# Patient Record
Sex: Female | Born: 1960 | Race: White | Hispanic: No | Marital: Married | Smoking: Never smoker
Health system: Southern US, Community
[De-identification: ages and names within clinical notes are randomized; demographics above are authoritative.]

## PROBLEM LIST (undated history)

## (undated) HISTORY — PX: HERNIA REPAIR: SHX51

---

## 1997-04-19 ENCOUNTER — Encounter (HOSPITAL_COMMUNITY): Admission: RE | Admit: 1997-04-19 | Discharge: 1997-07-18 | Payer: Self-pay | Admitting: Obstetrics & Gynecology

## 1999-07-28 ENCOUNTER — Other Ambulatory Visit: Admission: RE | Admit: 1999-07-28 | Discharge: 1999-07-28 | Payer: Self-pay | Admitting: Obstetrics & Gynecology

## 2004-04-14 ENCOUNTER — Other Ambulatory Visit: Admission: RE | Admit: 2004-04-14 | Discharge: 2004-04-14 | Payer: Self-pay | Admitting: Obstetrics & Gynecology

## 2008-09-17 ENCOUNTER — Encounter (INDEPENDENT_AMBULATORY_CARE_PROVIDER_SITE_OTHER): Payer: Self-pay | Admitting: General Surgery

## 2008-09-17 ENCOUNTER — Ambulatory Visit (HOSPITAL_BASED_OUTPATIENT_CLINIC_OR_DEPARTMENT_OTHER): Admission: RE | Admit: 2008-09-17 | Discharge: 2008-09-17 | Payer: Self-pay | Admitting: General Surgery

## 2010-05-16 LAB — CBC
HCT: 38.3 % (ref 36.0–46.0)
HCT: 38.3 % (ref 36.0–46.0)
Hemoglobin: 12.2 g/dL (ref 12.0–15.0)
Hemoglobin: 12.5 g/dL (ref 12.0–15.0)
MCHC: 31.8 g/dL (ref 30.0–36.0)
MCHC: 32.6 g/dL (ref 30.0–36.0)
Platelets: 203 10*3/uL (ref 150–400)
RBC: 4.83 MIL/uL (ref 3.87–5.11)
RDW: 18.3 % — ABNORMAL HIGH (ref 11.5–15.5)
RDW: 19 % — ABNORMAL HIGH (ref 11.5–15.5)

## 2010-05-16 LAB — DIFFERENTIAL
Basophils Absolute: 0 10*3/uL (ref 0.0–0.1)
Basophils Absolute: 0 10*3/uL (ref 0.0–0.1)
Basophils Relative: 1 % (ref 0–1)
Basophils Relative: 1 % (ref 0–1)
Eosinophils Relative: 2 % (ref 0–5)
Eosinophils Relative: 2 % (ref 0–5)
Lymphocytes Relative: 27 % (ref 12–46)
Lymphocytes Relative: 29 % (ref 12–46)
Monocytes Absolute: 0.4 10*3/uL (ref 0.1–1.0)
Monocytes Absolute: 0.5 10*3/uL (ref 0.1–1.0)
Monocytes Relative: 9 % (ref 3–12)
Neutro Abs: 3.4 10*3/uL (ref 1.7–7.7)

## 2010-05-16 LAB — POCT PREGNANCY, URINE: Preg Test, Ur: NEGATIVE

## 2010-06-23 NOTE — Op Note (Signed)
NAMEBERTINA, GUTHRIDGE              ACCOUNT NO.:  [id number]   MEDICAL RECORD NO.:  [id number]          PATIENT TYPE:  AMB   LOCATION:  [facility]                         FACILITY:  [facility]   PHYSICIAN:  Anselm Pancoast. Weatherly, M.D.DATE OF BIRTH:  1960/09/30   DATE OF PROCEDURE:  09/17/2008  DATE OF DISCHARGE:                               OPERATIVE REPORT   PREOPERATIVE DIAGNOSES:  Right inguinal hernia, indirect.   OPERATION:  Right inguinal herniorrhaphy.  General anesthesia.  Local  supplementation.   HISTORY:  Erica Byrd is a 50 year old Caucasian female who was  referred to me for repair of right inguinal hernia.  She works as a  Runner, broadcasting/film/video at [employer] and stated that for the last several  months she has had a bulge intermittently in the right groin and  scheduled the appointment herself.  I had repaired a hernia on her son  about 4 years ago, and in the office on examination, you could see a  definite bulge.  It was well demonstrated with the ultrasound in our  office and appeared to have no weakness noted on the left.  I could not  find any evidence of a femoral hernia on examination in the office, and  she is here for the planned procedure.  We checked her hemoglobin which  was normal.  Pregnancy test was normal, and she is not on any chronic  medication.  She was given a gram of Ancef and taken back to the  operative suite.  I had marked where the hernia was and initialed the  right side.  Induction of general anesthesia and LOA tube, and I first  anesthetized the ilioinguinal nerve area with about 7 or 8 mL of  Marcaine with adrenalin.  Of course, she had been prepped with Betadine  solution and draped in a sterile manner prior to being anesthetized.  I  then where I had marked the little area made a little incision above the  femoral crease and sharp dissection down through Scarpa's tissue.  There  was one superficial vein that was clamped, divided, and ligated with  Vicryl, and then the external oblique aponeurosis was opened through the  external ring.  You could see a definite indirect hernia sac coming out  of the ilioinguinal nerve was retracted and protected and pushed  superiorly, and then the hernia sac was separated.  The round ligament  was clamped and ligated with 3-0 Vicryl laterally, and then I opened the  hernia sac and did a high sac ligation with 2-0 Vicryl and 2-0 Prolene.  I then removed the hernia sac.  There was a weakness in the floor, and  this was closed in kind of a Bassini-type repair with running 2-0  Prolene starting at the symphysis pubis, going up and closing the  internal ring and then going back tying the 2 ends together.  I did not  think we need any mesh.  There was no tension in the area.  There was  good tissue structure, and this was predominantly an indirect hernia  with this kind of a weakness where  she has stretched the floor.  The  external oblique was closed with a running 2-0 Vicryl.  The ilioinguinal  nerve had been protected, and I put additional Marcaine in the hernia  sac, but it was opened with high sac ligation and then also in where I  had repaired the floor.  The subcutaneous tissue was anesthetized as we  were closing.  Also about 25 mL of Marcaine was used.  Four-0 Vicryl was  used subcuticularly and Benzoin and Steri-Strips on the skin.  The  patient has a little papilloma lateral right at panty line, and I  excised this and lightly cauterize this.  The patient will be released  after a short stay in the recovery room.      Anselm Pancoast. Zachery Dakins, M.D.  Electronically Signed     WJW/MEDQ  D:  09/17/2008  T:  09/17/2008  Job:  [id number]

## 2011-05-05 ENCOUNTER — Other Ambulatory Visit: Payer: Self-pay | Admitting: Obstetrics & Gynecology

## 2012-03-08 ENCOUNTER — Ambulatory Visit (INDEPENDENT_AMBULATORY_CARE_PROVIDER_SITE_OTHER): Payer: BC Managed Care – PPO | Admitting: Emergency Medicine

## 2012-03-08 VITALS — BP 139/85 | HR 65 | Temp 97.9°F | Resp 16 | Ht 64.0 in | Wt 139.0 lb

## 2012-03-08 DIAGNOSIS — J111 Influenza due to unidentified influenza virus with other respiratory manifestations: Secondary | ICD-10-CM

## 2012-03-08 DIAGNOSIS — R05 Cough: Secondary | ICD-10-CM

## 2012-03-08 DIAGNOSIS — J029 Acute pharyngitis, unspecified: Secondary | ICD-10-CM

## 2012-03-08 MED ORDER — BENZONATATE 100 MG PO CAPS: 100.0000 mg | ORAL_CAPSULE | Freq: Three times a day (TID) | ORAL | Status: AC | PRN

## 2012-03-08 MED ORDER — OSELTAMIVIR PHOSPHATE 75 MG PO CAPS: 75.0000 mg | ORAL_CAPSULE | Freq: Two times a day (BID) | ORAL | Status: AC

## 2012-03-08 NOTE — Patient Instructions (Addendum)
Influenza, Adult Influenza ("the flu") is a viral infection of the respiratory tract. It occurs more often in winter months because people spend more time in close contact with one another. Influenza can make you feel very sick. Influenza easily spreads from person to person (contagious). CAUSES   Influenza is caused by a virus that infects the respiratory tract. You can catch the virus by breathing in droplets from an infected person's cough or sneeze. You can also catch the virus by touching something that was recently contaminated with the virus and then touching your mouth, nose, or eyes. SYMPTOMS   Symptoms typically last 4 to 10 days and may include:  Fever.   Chills.   Headache, body aches, and muscle aches.   Sore throat.   Chest discomfort and cough.   Poor appetite.   Weakness or feeling tired.   Dizziness.   Nausea or vomiting.  DIAGNOSIS   Diagnosis of influenza is often made based on your history and a physical exam. A nose or throat swab test can be done to confirm the diagnosis. RISKS AND COMPLICATIONS You may be at risk for a more severe case of influenza if you smoke cigarettes, have diabetes, have chronic heart disease (such as heart failure) or lung disease (such as asthma), or if you have a weakened immune system. Elderly people and pregnant women are also at risk for more serious infections. The most common complication of influenza is a lung infection (pneumonia). Sometimes, this complication can require emergency medical care and may be life-threatening. PREVENTION   An annual influenza vaccination (flu shot) is the best way to avoid getting influenza. An annual flu shot is now routinely recommended for all adults in the [place] TREATMENT   In mild cases, influenza goes away on its own. Treatment is directed at relieving symptoms. For more severe cases, your caregiver may prescribe antiviral medicines to shorten the sickness. Antibiotic medicines are not effective,  because the infection is caused by a virus, not by bacteria. HOME CARE INSTRUCTIONS  Only take over-the-counter or prescription medicines for pain, discomfort, or fever as directed by your caregiver.   Use a cool mist humidifier to make breathing easier.   Get plenty of rest until your temperature returns to normal. This usually takes 3 to 4 days.   Drink enough fluids to keep your urine clear or pale yellow.   Cover your mouth and nose when coughing or sneezing, and wash your hands well to avoid spreading the virus.   Stay home from work or school until your fever has been gone for at least 1 full day.  SEEK MEDICAL CARE IF:    You have chest pain or a deep cough that worsens or produces more mucus.   You have nausea, vomiting, or diarrhea.  SEEK IMMEDIATE MEDICAL CARE IF:    You have difficulty breathing, shortness of breath, or your skin or nails turn bluish.   You have severe neck pain or stiffness.   You have a severe headache, facial pain, or earache.   You have a worsening or recurring fever.   You have nausea or vomiting that cannot be controlled.  MAKE SURE YOU:  Understand these instructions.   Will watch your condition.   Will get help right away if you are not doing well or get worse.  Document Released: 01/23/2000 Document Revised: 07/27/2011 Document Reviewed: 04/26/2011 Exit[employer] Patient Information 2013 [employer].    

## 2012-03-08 NOTE — Progress Notes (Signed)
  Subjective:    Patient ID: Erica Byrd, female    DOB: 1960-07-14, 52 y.o.   MRN: [id number]  HPI A 52 year old female presents to clinic with flu like symptoms.  She has symptoms of nasal congestion, body aches, cough, and sore throat. The symptoms started on Monday and got worse over the last day. She has pressure and congestion in her sinuses but no fever. 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     Review of Systems     Objective:   Physical Exam patient is alert and cooperative not ill-appearing. HEENT exam. TMs are clear. Nose is congested. Throat is slightly red the chest is clear to both auscultation and percussion. Cardiac exam is regular rate without murmurs  Results for orders placed in visit on 03/08/12  POCT RAPID STREP A (OFFICE)      Component Value Range   Rapid Strep A Screen Negative  Negative  POCT INFLUENZA A/B      Component Value Range   Influenza A, POC Negative     Influenza B, POC Negative          Assessment & Plan:  Patient presents with flulike symptoms. Flu test is negative but we'll go ahead and treat for flu .

## 2013-01-24 ENCOUNTER — Ambulatory Visit (INDEPENDENT_AMBULATORY_CARE_PROVIDER_SITE_OTHER): Payer: BC Managed Care – PPO | Admitting: Emergency Medicine

## 2013-01-24 ENCOUNTER — Ambulatory Visit: Payer: BC Managed Care – PPO

## 2013-01-24 VITALS — BP 110/68 | HR 98 | Temp 100.2°F | Resp 18 | Ht 65.0 in | Wt 139.0 lb

## 2013-01-24 DIAGNOSIS — R059 Cough, unspecified: Secondary | ICD-10-CM

## 2013-01-24 DIAGNOSIS — R509 Fever, unspecified: Secondary | ICD-10-CM

## 2013-01-24 DIAGNOSIS — J111 Influenza due to unidentified influenza virus with other respiratory manifestations: Secondary | ICD-10-CM

## 2013-01-24 DIAGNOSIS — R05 Cough: Secondary | ICD-10-CM

## 2013-01-24 DIAGNOSIS — J101 Influenza due to other identified influenza virus with other respiratory manifestations: Secondary | ICD-10-CM

## 2013-01-24 LAB — POCT CBC
Granulocyte percent: 77.1 %G (ref 37–80)
HCT, POC: 45.1 % (ref 37.7–47.9)
Hemoglobin: 14 g/dL (ref 12.2–16.2)
Lymph, poc: 1 (ref 0.6–3.4)
MCH, POC: 29.9 pg (ref 27–31.2)
MCHC: 31 g/dL — AB (ref 31.8–35.4)
MCV: 96.4 fL (ref 80–97)
MID (cbc): 0.4 (ref 0–0.9)
MPV: 10.4 fL (ref 0–99.8)
POC Granulocyte: 4.5 (ref 2–6.9)
POC LYMPH PERCENT: 16.4 %L (ref 10–50)
POC MID %: 6.5 %M (ref 0–12)
Platelet Count, POC: 180 10*3/uL (ref 142–424)
RBC: 4.68 M/uL (ref 4.04–5.48)
RDW, POC: 14.9 %
WBC: 5.8 10*3/uL (ref 4.6–10.2)

## 2013-01-24 LAB — POCT INFLUENZA A/B
Influenza A, POC: POSITIVE
Influenza B, POC: NEGATIVE

## 2013-01-24 MED ORDER — HYDROCOD POLST-CHLORPHEN POLST 10-8 MG/5ML PO LQCR: 5.0000 mL | Freq: Two times a day (BID) | ORAL | Status: AC | PRN

## 2013-01-24 MED ORDER — OSELTAMIVIR PHOSPHATE 75 MG PO CAPS: 75.0000 mg | ORAL_CAPSULE | Freq: Two times a day (BID) | ORAL | Status: AC

## 2013-01-24 NOTE — Progress Notes (Signed)
Subjective:    Patient ID: Erica Byrd, female    DOB: 12-Oct-1960, 52 y.o.   MRN: [id number]  Cough Associated symptoms include a fever, postnasal drip and rhinorrhea. Pertinent negatives include no ear pain, headaches, sore throat, shortness of breath or wheezing.  Fever  Associated symptoms include congestion and coughing. Pertinent negatives include no ear pain, headaches, nausea, sore throat, vomiting or wheezing.   52 year old female presents for evaluation of several symptoms. Complains of cough x 5 weeks. Has been treating at home because she thought she had a virus. 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  At onset 5 weeks ago, she did have low grade fever of 99-100.0. She has been taking dayquil/nyquil which has helped some with the cough.  Admits she did get her flu shot this year.  Denies nausea, vomiting, headache, sore throat, otalgia, chest pain, SOB, wheezing, or sinus pain.    Review of Systems  Constitutional: Positive for fever.  HENT: Positive for congestion, postnasal drip and rhinorrhea. Negative for ear pain, sinus pressure and sore throat.   Respiratory: Positive for cough. Negative for shortness of breath and wheezing.   Gastrointestinal: Negative for nausea and vomiting.  Neurological: Negative for dizziness and headaches.       Objective:   Physical Exam  Constitutional: She is oriented to person, place, and time. She appears well-developed and well-nourished.  HENT:  Head: Normocephalic and atraumatic.  Right Ear: Hearing, tympanic membrane, external ear and ear canal normal.  Left Ear: Hearing, tympanic membrane, external ear and ear canal normal.  Mouth/Throat: Uvula is midline, oropharynx is clear and moist and mucous membranes are normal.  Eyes: Conjunctivae are normal.  Neck: Normal range of motion. Neck supple.  Cardiovascular: Normal rate, regular rhythm and normal heart sounds.   Pulmonary/Chest:  Effort normal and breath sounds normal.  Lymphadenopathy:    She has no cervical adenopathy.  Neurological: She is alert and oriented to person, place, and time.  Psychiatric: She has a normal mood and affect. Her behavior is normal. Judgment and thought content normal.     Results for orders placed in visit on 01/24/13  POCT CBC      Result Value Range   WBC 5.8  4.6 - 10.2 K/uL   Lymph, poc 1.0  0.6 - 3.4   POC LYMPH PERCENT 16.4  10 - 50 %L   MID (cbc) 0.4  0 - 0.9   POC MID % 6.5  0 - 12 %M   POC Granulocyte 4.5  2 - 6.9   Granulocyte percent 77.1  37 - 80 %G   RBC 4.68  4.04 - 5.48 M/uL   Hemoglobin 14.0  12.2 - 16.2 g/dL   HCT, POC 40.9  81.1 - 47.9 %   MCV 96.4  80 - 97 fL   MCH, POC 29.9  27 - 31.2 pg   MCHC 31.0 (*) 31.8 - 35.4 g/dL   RDW, POC 91.4     Platelet Count, POC 180  142 - 424 K/uL   MPV 10.4  0 - 99.8 fL  POCT INFLUENZA A/B      Result Value Range   Influenza A, POC Positive     Influenza B, POC Negative      [facility] reading (PRIMARY) by  Dr. Cleta Alberts as mild blunting of right costophrenic angle. No acute infiltrate or consolidation.       Assessment & Plan:  Cough - Plan: POCT CBC, DG Chest 2 View, chlorpheniramine-HYDROcodone (TUSSIONEX PENNKINETIC ER) 10-8 MG/5ML LQCR  Fever, unspecified - Plan: POCT CBC, POCT Influenza A/B, DG Chest 2 View  Influenza A - Plan: oseltamivir (TAMIFLU) 75 MG capsule  Will go ahead and start Tamiflu 75 mg twice daily x 5 days Tussionex q12 hours prn cough Increase fluids and rest Continue Tylenol or ibuprofen as needed for fever and chills Follow up if symptoms worsen or fail to improve  I have also sent a prescription for Tamiflu for her son, Jaylen Claude

## 2015-01-01 IMAGING — CR DG CHEST 2V
2 series · 2 of 2 positions shown · non-contrast
Comparison: None.

CLINICAL DATA: Cough and fever.

EXAM:
CHEST  2 VIEW

[PA]
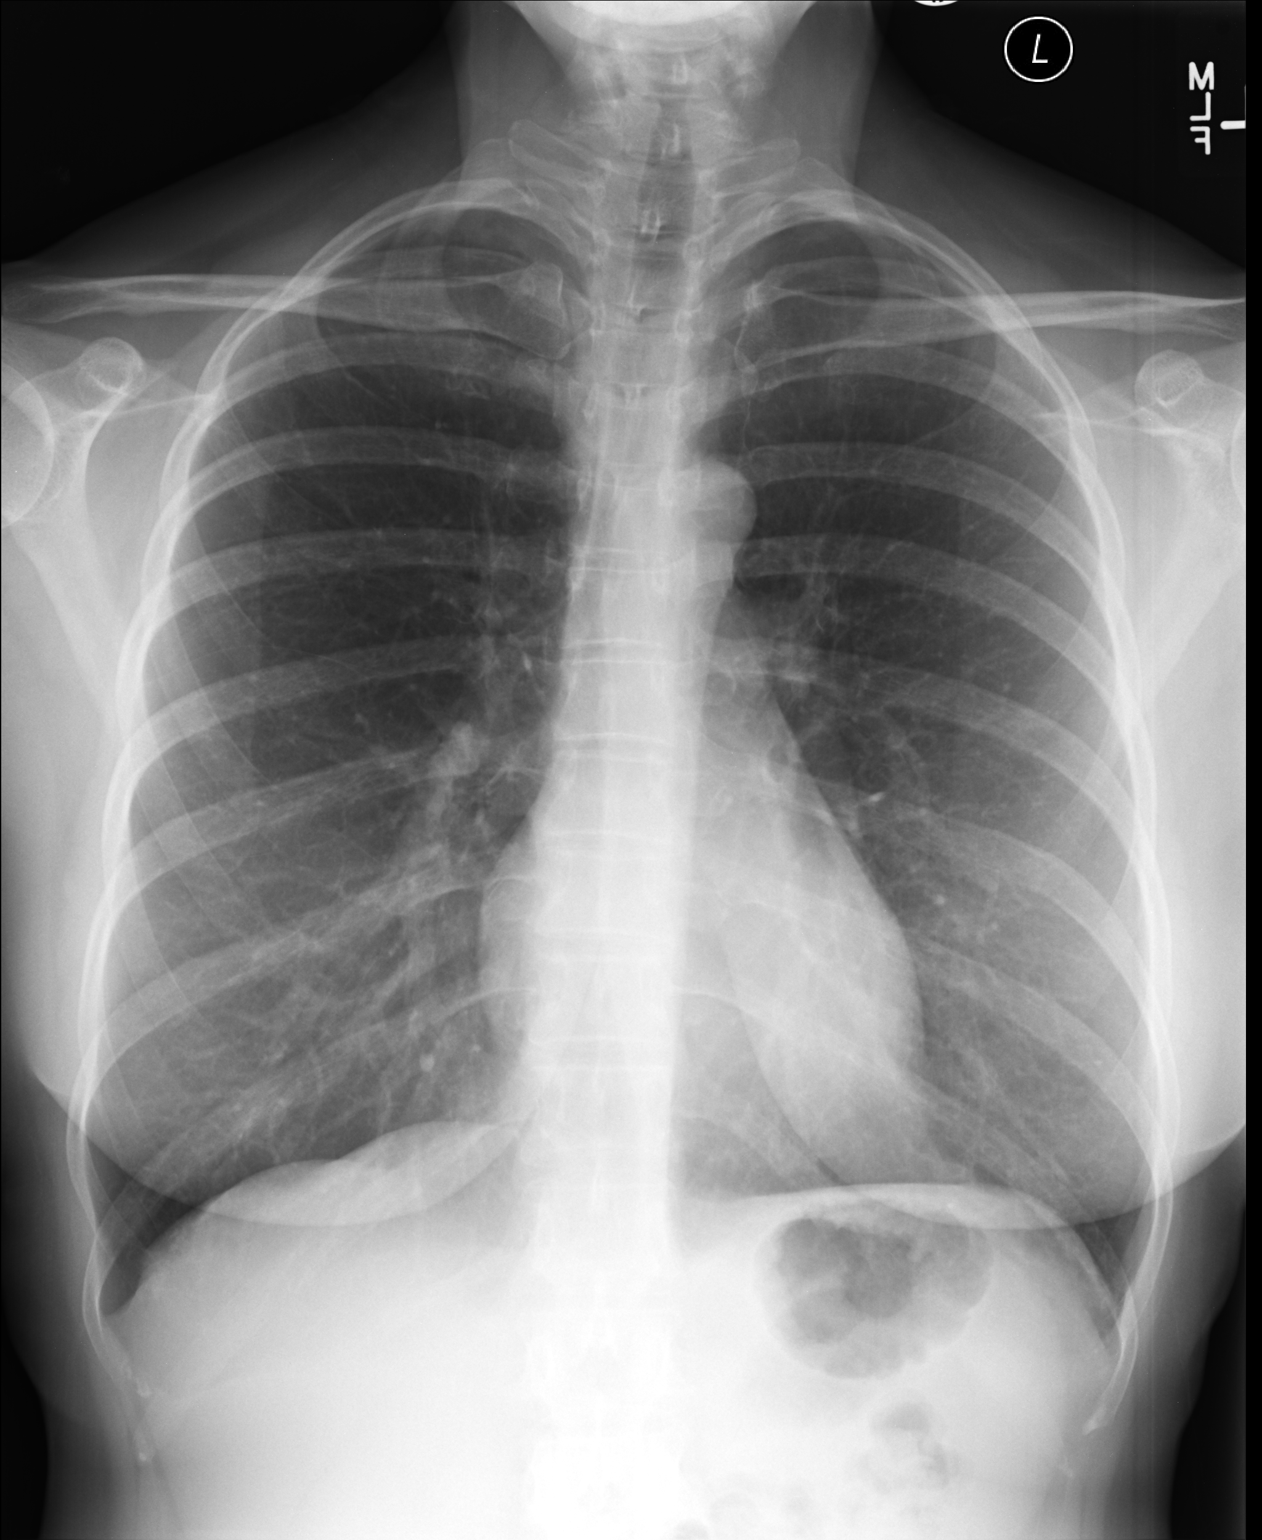

[lateral]
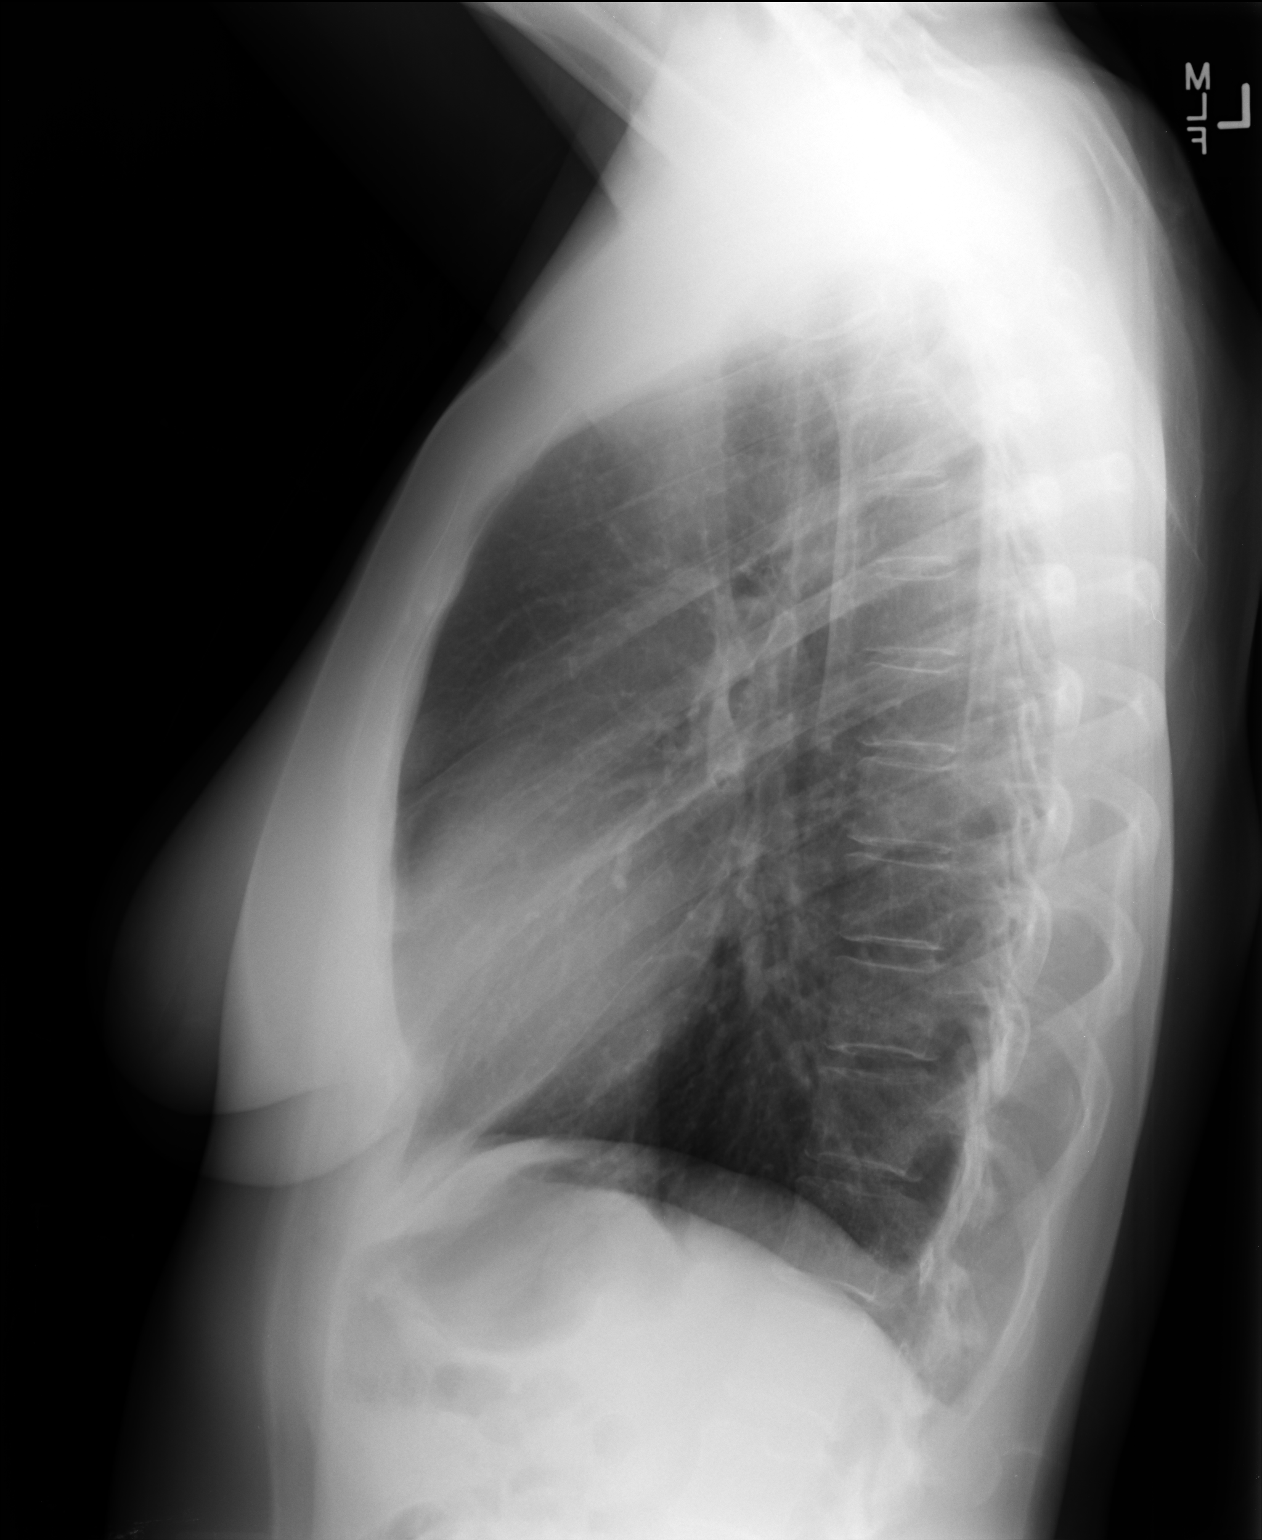

[2 of 2 positions shown; findings below may reference images not displayed]

FINDINGS: Trachea is midline. Heart size normal. Lungs are somewhat
hyperinflated but clear. No pleural fluid.
IMPRESSION: No acute findings.

## 2018-01-04 ENCOUNTER — Ambulatory Visit (INDEPENDENT_AMBULATORY_CARE_PROVIDER_SITE_OTHER): Payer: Self-pay

## 2018-01-04 ENCOUNTER — Encounter (INDEPENDENT_AMBULATORY_CARE_PROVIDER_SITE_OTHER): Payer: Self-pay | Admitting: Family Medicine

## 2018-01-04 ENCOUNTER — Ambulatory Visit (INDEPENDENT_AMBULATORY_CARE_PROVIDER_SITE_OTHER): Payer: BC Managed Care – PPO | Admitting: Family Medicine

## 2018-01-04 DIAGNOSIS — M79641 Pain in right hand: Secondary | ICD-10-CM

## 2018-01-04 NOTE — Progress Notes (Signed)
   Office Visit Note   Patient: Erica SamsMartha A Templeton           Date of Birth: 12/16/1960           MRN: [id number] Visit Date: 01/04/2018 Requested by: No referring provider defined for this encounter. PCP: System, Pcp Not In  Subjective: Chief Complaint  Patient presents with  . Right Hand - Pain, Injury    Fell on ice/snow while out of town 2 weeks ago. Caught self with outstretched hand, hitting elbow and hand on the ground.  Right hand dominant. Is a sign language interpreter.    HPI: She is a 57 year old right-hand-dominant female with right hand pain.  [place] play [place] [employer] in football.  On her way out, she slipped on ice and fell trying to catch herself with her right hand.  Immediate pain and swelling, but she thought she had just bruised it.  The swelling has improved but her pain has not gone away completely especially with certain twisting movements or when trying to write.  No previous injuries to her hand.  She points to the dorsum of her hand near the wrist as the location of her pain.              ROS: She is otherwise in good health, all other systems were negative.  Objective: Vital Signs: There were no vitals taken for this visit.  Physical Exam:  Right hand: No swelling or bruising visible.  She is tender primarily on the dorsal aspect of her wrist at the base of the fourth and fifth metacarpals.  A little bit of pain in the TFC, little bit of tenderness in the anatomic snuffbox.  Imaging: X-rays right hand: Anatomic alignment, I do not think she has a definite fracture.  On the lateral view I question whether she might have an avulsion fracture, but this was not readily evident with limited ultrasound imaging which was not recorded.    Assessment & Plan: 1.  2 weeks status post fall with right wrist pain, possible sprain versus occult fracture -Removable splint for comfort, anticipate 3 to 4  weeks healing time.  If still having pain at that point we will recheck with three-view x-rays.   Follow-Up Instructions: No follow-ups on file.      Procedures: No procedures performed  No notes on file    PMFS History: There are no active problems to display for this patient.  History reviewed. No pertinent past medical history.  History reviewed. No pertinent family history.  Past Surgical History:  Procedure Laterality Date  . HERNIA REPAIR     Social History   Occupational History  . Not on file  Tobacco Use  . Smoking status: Never Smoker  Substance and Sexual Activity  . Alcohol use: Not on file  . Drug use: Not on file  . Sexual activity: Not on file

## 2018-11-06 ENCOUNTER — Other Ambulatory Visit: Payer: Self-pay

## 2018-11-06 DIAGNOSIS — Z20822 Contact with and (suspected) exposure to covid-19: Secondary | ICD-10-CM

## 2018-11-07 LAB — NOVEL CORONAVIRUS, NAA: SARS-CoV-2, NAA: NOT DETECTED

## 2020-01-18 ENCOUNTER — Other Ambulatory Visit: Payer: Self-pay | Admitting: Obstetrics & Gynecology

## 2020-01-18 DIAGNOSIS — R928 Other abnormal and inconclusive findings on diagnostic imaging of breast: Secondary | ICD-10-CM

## 2020-01-25 ENCOUNTER — Ambulatory Visit
Admission: RE | Admit: 2020-01-25 | Discharge: 2020-01-25 | Disposition: A | Discharge status: Home / Self Care | Payer: Self-pay | Source: Ambulatory Visit | Attending: Obstetrics & Gynecology | Admitting: Obstetrics & Gynecology

## 2020-01-25 ENCOUNTER — Ambulatory Visit: Payer: Self-pay

## 2020-01-25 ENCOUNTER — Other Ambulatory Visit: Payer: Self-pay

## 2020-01-25 DIAGNOSIS — R928 Other abnormal and inconclusive findings on diagnostic imaging of breast: Secondary | ICD-10-CM

## 2020-02-04 ENCOUNTER — Other Ambulatory Visit: Payer: Self-pay

## 2021-09-28 ENCOUNTER — Other Ambulatory Visit: Payer: Self-pay | Admitting: Family Medicine

## 2021-09-28 DIAGNOSIS — Z1231 Encounter for screening mammogram for malignant neoplasm of breast: Secondary | ICD-10-CM

## 2021-10-14 ENCOUNTER — Ambulatory Visit
Admission: RE | Admit: 2021-10-14 | Discharge: 2021-10-14 | Disposition: A | Discharge status: Home / Self Care | Payer: BC Managed Care – PPO | Source: Ambulatory Visit | Attending: Family Medicine | Admitting: Family Medicine

## 2021-10-14 DIAGNOSIS — Z1231 Encounter for screening mammogram for malignant neoplasm of breast: Secondary | ICD-10-CM

## 2021-10-16 ENCOUNTER — Other Ambulatory Visit: Payer: Self-pay | Admitting: Family Medicine

## 2021-10-16 DIAGNOSIS — E2839 Other primary ovarian failure: Secondary | ICD-10-CM

## 2021-11-14 ENCOUNTER — Emergency Department (HOSPITAL_BASED_OUTPATIENT_CLINIC_OR_DEPARTMENT_OTHER): Payer: BC Managed Care – PPO | Admitting: Radiology

## 2021-11-14 ENCOUNTER — Encounter (HOSPITAL_BASED_OUTPATIENT_CLINIC_OR_DEPARTMENT_OTHER): Payer: Self-pay

## 2021-11-14 ENCOUNTER — Emergency Department (HOSPITAL_BASED_OUTPATIENT_CLINIC_OR_DEPARTMENT_OTHER)
Admission: EM | Admit: 2021-11-14 | Discharge: 2021-11-14 | Disposition: A | Discharge status: Home / Self Care | Payer: BC Managed Care – PPO | Attending: Emergency Medicine | Admitting: Emergency Medicine

## 2021-11-14 ENCOUNTER — Emergency Department (HOSPITAL_BASED_OUTPATIENT_CLINIC_OR_DEPARTMENT_OTHER): Payer: BC Managed Care – PPO

## 2021-11-14 ENCOUNTER — Other Ambulatory Visit: Payer: Self-pay

## 2021-11-14 DIAGNOSIS — S60212A Contusion of left wrist, initial encounter: Secondary | ICD-10-CM | POA: Insufficient documentation

## 2021-11-14 DIAGNOSIS — S60211A Contusion of right wrist, initial encounter: Secondary | ICD-10-CM

## 2021-11-14 DIAGNOSIS — S0990XA Unspecified injury of head, initial encounter: Secondary | ICD-10-CM | POA: Diagnosis present

## 2021-11-14 DIAGNOSIS — X501XXA Overexertion from prolonged static or awkward postures, initial encounter: Secondary | ICD-10-CM | POA: Diagnosis not present

## 2021-11-14 DIAGNOSIS — S0083XA Contusion of other part of head, initial encounter: Secondary | ICD-10-CM

## 2021-11-14 MED ORDER — KETOROLAC TROMETHAMINE 30 MG/ML IJ SOLN
30.0000 mg | Freq: Once | INTRAMUSCULAR | Status: AC
  Administered 2021-11-14: 30 mg via INTRAMUSCULAR
  Filled 2021-11-14: qty 1

## 2021-11-14 NOTE — ED Triage Notes (Signed)
Pt arrives POV from home stating she rolled her left ankle last night but did not seek treatment for that injury until this am.    Prior to being seen for the left ankle injury, she was attempting to go down the steps in her home using crutches and she fell, hitting the left side of her head.  She denies losing consciousness.  She denies neck pain. She does not take blood thinners and had only taken Aleve for her left foot.  She was seen at [facility] for the left foot/ankle injury and placed in a walking boot.  The provider at [facility] sent her to ED for CT of head.   Pt ambulatory to triage with crutches, in NAD.

## 2021-11-14 NOTE — ED Provider Notes (Signed)
[facility] Provider Note   CSN: [id number] Arrival date & time: 11/14/21  1337     History  Chief Complaint  Patient presents with   Erica Byrd    Erica SamsMartha A Byrd is a 61 y.o. female.  Pt is a 61 yo female with no significant pmhx.  Pt said she fell last night and hurt her ankle.  She was getting ready for a wedding and was wearing heels and her ankle turned.  She had crutches from a prior injury, so was using them today on her way to go to [facility].  She fell again and hit her head on the stairs.  She did not have a loc.  She took an Aleve for pain earlier this am.  Pt has right and left wrist pain, upper back pain, and left ankle pain.  [facility] took an xray of her ankle and she was told it was broken. They x-rayed the right wrist which was neg for fx.         Home Medications Prior to Admission medications   Medication Sig Start Date End Date Taking? Authorizing Provider  benzonatate (TESSALON) 100 MG capsule Take 1-2 capsules (100-200 mg total) by mouth 3 (three) times daily as needed for cough. Patient not taking: Reported on 01/04/2018 03/08/12   Collene Gobbleaub, Steven A, MD  chlorpheniramine-HYDROcodone [facility](TUSSIONEX PENNKINETIC ER) 10-8 MG/5ML LQCR Take 5 mLs by mouth every 12 (twelve) hours as needed for cough (cough). Patient not taking: Reported on 01/04/2018 01/24/13   Nelva NayMarte, Heather M, PA-C  naproxen sodium (ALEVE) 220 MG tablet Take 220 mg by mouth daily as needed.    [provider]  oseltamivir (TAMIFLU) 75 MG capsule Take 1 capsule (75 mg total) by mouth 2 (two) times daily. Patient not taking: Reported on 01/04/2018 03/08/12   Collene Gobbleaub, Steven A, MD  oseltamivir (TAMIFLU) 75 MG capsule Take 1 capsule (75 mg total) by mouth 2 (two) times daily. Patient not taking: Reported on 01/04/2018 01/24/13   Nelva NayMarte, Heather M, PA-C  Pseudoeph-Doxylamine-DM-APAP (NYQUIL PO) Take by mouth.    [provider]  Pseudoephedrine-APAP-DM (DAYQUIL PO) Take  by mouth.    [provider]      Allergies    Patient has no known allergies.    Review of Systems   Review of Systems  Musculoskeletal:        Right wrist, left wrist, back pain, left ankle pain  Neurological:  Positive for headaches.  All other systems reviewed and are negative.   Physical Exam Updated Vital Signs BP 133/62   Pulse 81   Temp 98 F (36.7 C) (Oral)   Resp 16   Ht 5\' 4"  (1.626 m)   Wt 63.5 kg   SpO2 100%   BMI 24.03 kg/m  Physical Exam Vitals and nursing note reviewed.  Constitutional:      Appearance: Normal appearance.  HENT:     Head: Normocephalic.     Comments: Large hematoma left scalp; No facial tenderness    Right Ear: External ear normal.     Left Ear: External ear normal.     Nose: Nose normal.     Mouth/Throat:     Mouth: Mucous membranes are moist.     Pharynx: Oropharynx is clear.  Eyes:     Extraocular Movements: Extraocular movements intact.     Conjunctiva/sclera: Conjunctivae normal.     Pupils: Pupils are equal, round, and reactive to light.  Cardiovascular:     Rate  and Rhythm: Normal rate and regular rhythm.     Pulses: Normal pulses.     Heart sounds: Normal heart sounds.  Pulmonary:     Effort: Pulmonary effort is normal.     Breath sounds: Normal breath sounds.  Abdominal:     General: Abdomen is flat. Bowel sounds are normal.     Palpations: Abdomen is soft.  Musculoskeletal:       Arms:     Cervical back: Normal range of motion and neck supple.     Comments: Right wrist with some bruising, but good ROM Left wrist with some bruising, but good ROM Left ankle in cam walker boot  Skin:    General: Skin is warm.     Capillary Refill: Capillary refill takes less than 2 seconds.  Neurological:     General: No focal deficit present.     Mental Status: She is alert and oriented to person, place, and time.  Psychiatric:        Mood and Affect: Mood normal.        Behavior: Behavior normal.     ED Results  / Procedures / Treatments   Labs (all labs ordered are listed, but only abnormal results are displayed) Labs Reviewed - No data to display  EKG None  Radiology DG Wrist Complete Left  Result Date: 11/14/2021 CLINICAL DATA:  Fall.  Left wrist injury and pain. EXAM: LEFT WRIST - COMPLETE 3+ VIEW COMPARISON:  None Available. FINDINGS: There is no evidence of fracture or dislocation. There is no evidence of arthropathy or other focal bone abnormality. Soft tissues are unremarkable. IMPRESSION: Negative. Electronically Signed   By: Marlaine Hind M.D.   On: 11/14/2021 18:06   DG Chest 2 View  Result Date: 11/14/2021 CLINICAL DATA:  Fall. EXAM: CHEST - 2 VIEW COMPARISON:  01/24/2013 FINDINGS: The heart size and mediastinal contours are within normal limits. Both lungs are clear. The visualized skeletal structures are unremarkable. IMPRESSION: No active cardiopulmonary disease. Electronically Signed   By: Marlaine Hind M.D.   On: 11/14/2021 18:05   CT Head Wo Contrast  Result Date: 11/14/2021 CLINICAL DATA:  None. EXAM: CT HEAD WITHOUT CONTRAST TECHNIQUE: Contiguous axial images were obtained from the base of the skull through the vertex without intravenous contrast. RADIATION DOSE REDUCTION: This exam was performed according to the departmental dose-optimization program which includes automated exposure control, adjustment of the mA and/or kV according to patient size and/or use of iterative reconstruction technique. COMPARISON:  None FINDINGS: Brain: No acute intracranial abnormality. Specifically, no hemorrhage, hydrocephalus, mass lesion, acute infarction, or significant intracranial injury. Vascular: No hyperdense vessel or unexpected calcification. Skull: No acute calvarial abnormality. Sinuses/Orbits: No acute findings Other: Large scalp hematoma in the left forehead and frontal region. IMPRESSION: No acute intracranial abnormality. Electronically Signed   By: Rolm Baptise M.D.   On: 11/14/2021 14:32     Procedures Procedures    Medications Ordered in ED Medications  ketorolac (TORADOL) 30 MG/ML injection 30 mg (30 mg Intramuscular Given 11/14/21 1822)    ED Course/ Medical Decision Making/ A&P                           Medical Decision Making Amount and/or Complexity of Data Reviewed Radiology: ordered.  Risk Prescription drug management.   This patient presents to the ED for concern of fall, this involves an extensive number of treatment options, and is a complaint that carries with it  a high risk of complications and morbidity.  The differential diagnosis includes multiple trauma   Co morbidities that complicate the patient evaluation  none   Additional history obtained:  Additional history obtained from epic chart review   Imaging Studies ordered:  I ordered imaging studies including cxr, left wrist, ct head  I independently visualized and interpreted imaging which showed  CXR: IMPRESSION:  No active cardiopulmonary disease.  Left Wrist: IMPRESSION:  Negative.  CT head: FINDINGS:  Brain: No acute intracranial abnormality. Specifically, no  hemorrhage, hydrocephalus, mass lesion, acute infarction, or  significant intracranial injury.    Vascular: No hyperdense vessel or unexpected calcification.    Skull: No acute calvarial abnormality.    Sinuses/Orbits: No acute findings    Other: Large scalp hematoma in the left forehead and frontal region.   I agree with the radiologist interpretation    Medicines ordered and prescription drug management:  I ordered medication including toradol  for pain  Reevaluation of the patient after these medicines showed that the patient improved I have reviewed the patients home medicines and have made adjustments as needed   Test Considered:  Ct  Problem List / ED Course:  Left ankle fx:  managed per [facility].  Pt in cam walker Bilateral wrist contusions:  right wrist imaged earlier today at [facility].  No  reported fx.  Left wrist with no fx today. Back pain:  no fx Scalp hematoma:  no internal brain injury   Reevaluation:  After the interventions noted above, I reevaluated the patient and found that they have :improved   Social Determinants of Health:  Lives at home   Dispostion:  After consideration of the diagnostic results and the patients response to treatment, I feel that the patent would benefit from discharge with outpatient f/u.          Final Clinical Impression(s) / ED Diagnoses Final diagnoses:  Contusion of forehead, initial encounter  Contusion of right wrist, initial encounter  Contusion of left wrist, initial encounter    Rx / DC Orders ED Discharge Orders     None         Isla Pence, MD 11/14/21 1829

## 2021-11-19 ENCOUNTER — Other Ambulatory Visit: Payer: BC Managed Care – PPO

## 2021-12-03 ENCOUNTER — Other Ambulatory Visit: Payer: BC Managed Care – PPO

## 2021-12-17 ENCOUNTER — Ambulatory Visit
Admission: RE | Admit: 2021-12-17 | Discharge: 2021-12-17 | Disposition: A | Discharge status: Home / Self Care | Payer: BC Managed Care – PPO | Source: Ambulatory Visit | Attending: Family Medicine | Admitting: Family Medicine

## 2021-12-17 DIAGNOSIS — E2839 Other primary ovarian failure: Secondary | ICD-10-CM

## 2022-01-01 IMAGING — MG MM DIGITAL DIAGNOSTIC UNILAT*L* W/ TOMO W/ CAD
4 series · 4 of 12 positions shown · non-contrast
Comparison: Previous exam(s).

CLINICAL DATA: 59-year-old female recalled from screening mammogram
dated 01/14/2020 for a possible left breast asymmetry.

EXAM:
DIGITAL DIAGNOSTIC UNILATERAL LEFT MAMMOGRAM WITH TOMO AND CAD

[L MLO synth-2D]
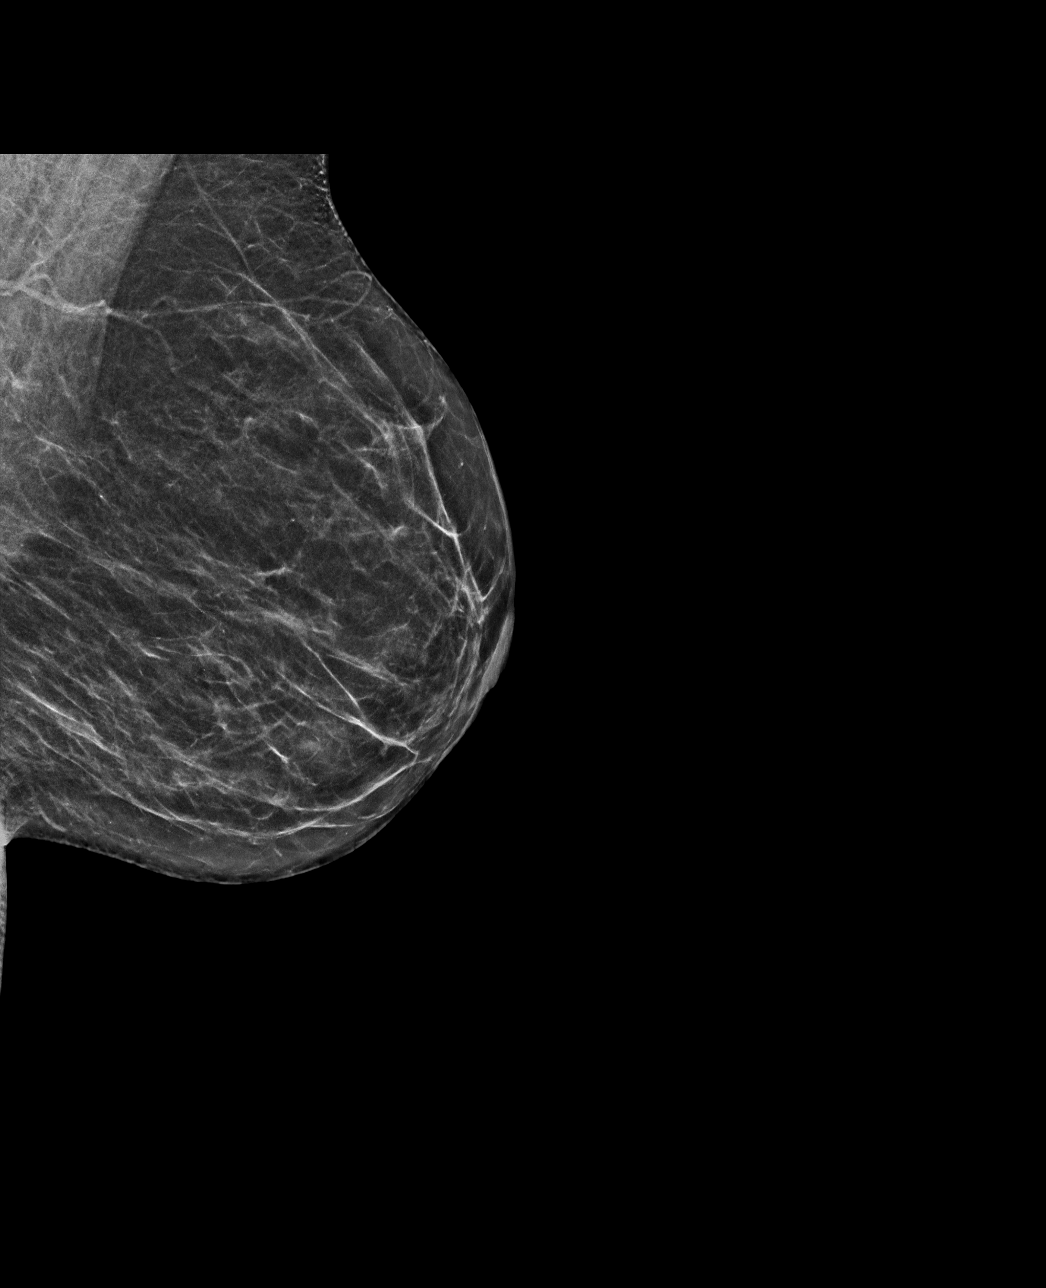

[L CC synth-2D]
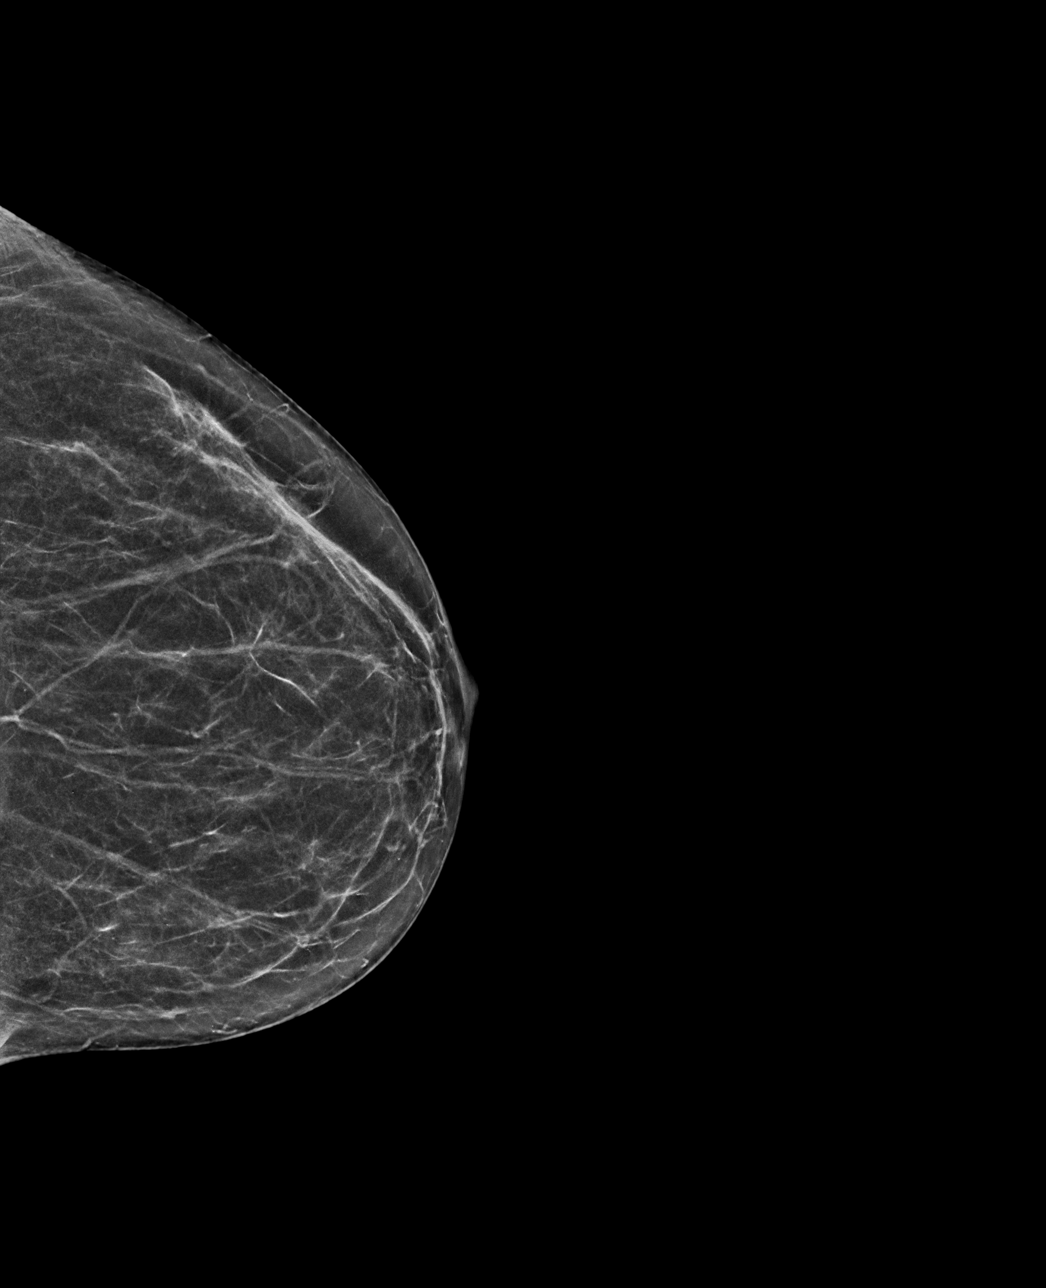

[L CC tomo · tomo slice 27/54.0]
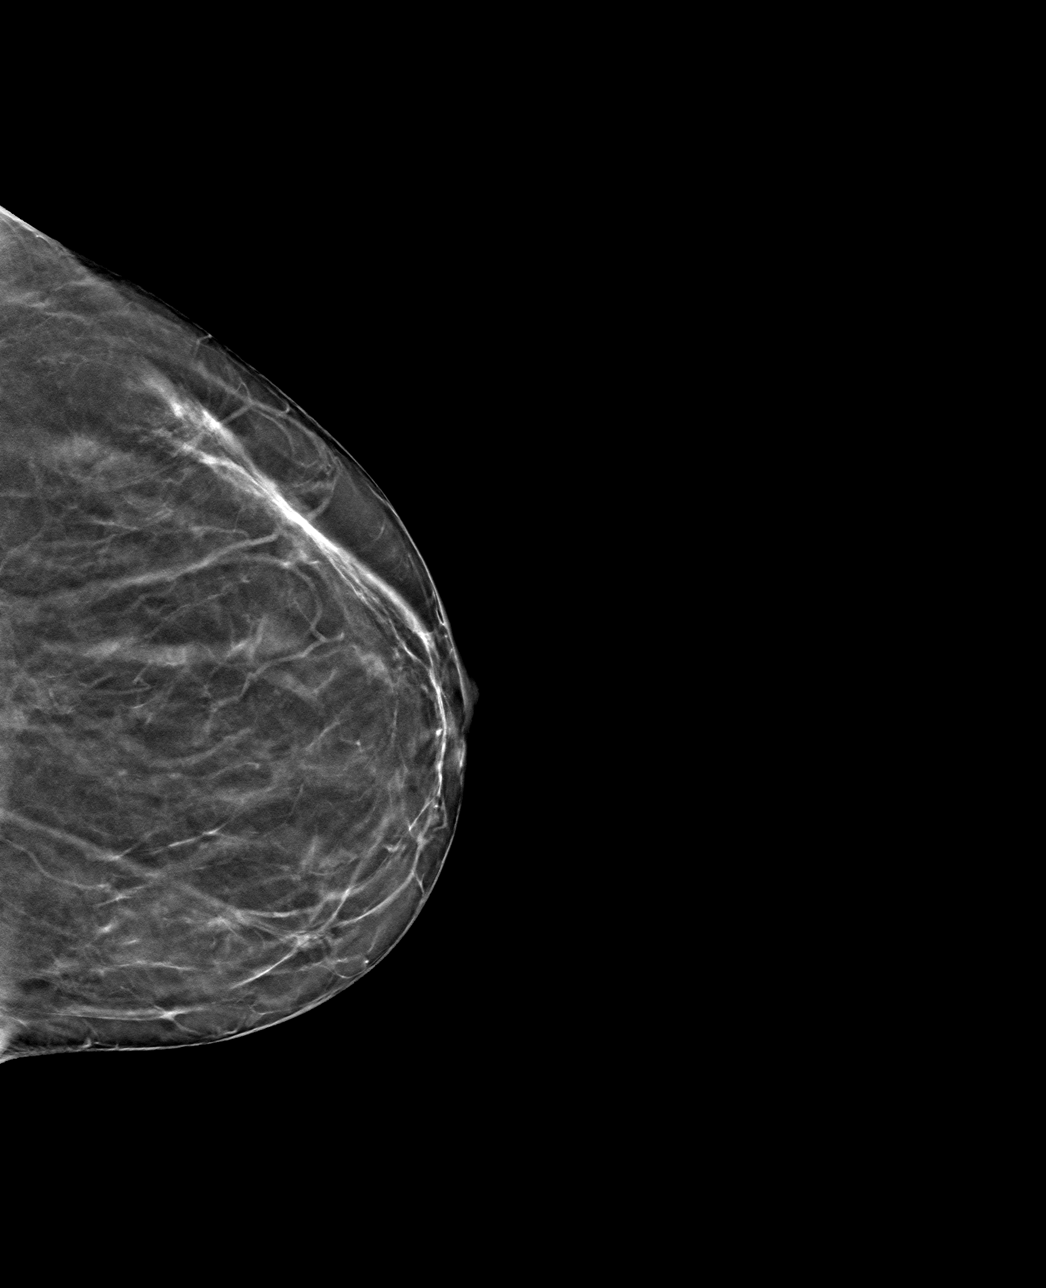

[L MLO tomo · tomo slice 29/57.0]
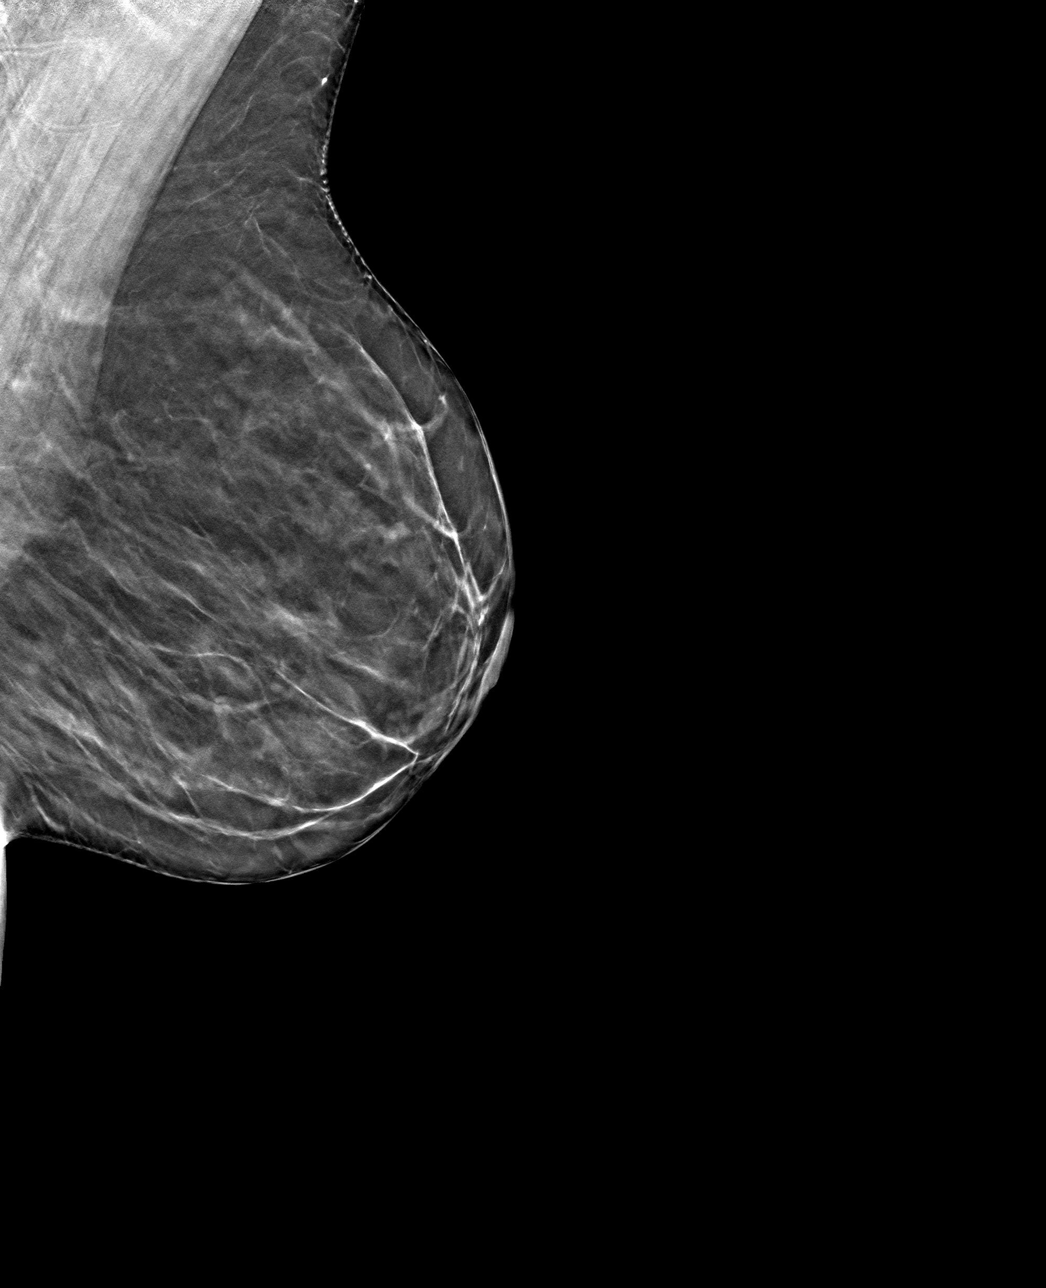

[4 of 12 positions shown; findings below may reference images not displayed]

ACR Breast Density Category b: There are scattered areas of
fibroglandular density.
FINDINGS: Previously described, possible focal asymmetry in the slightly lower
outer left breast posteriorly resolves into well dispersed
fibroglandular tissue on today's 3D views. No suspicious findings
identified.

Mammographic images were processed with CAD.
IMPRESSION: No mammographic evidence of malignancy.

RECOMMENDATION:
Screening mammogram in one year.(Code:[id number])

I have discussed the findings and recommendations with the patient.
If applicable, a reminder letter will be sent to the patient
regarding the next appointment.

BI-RADS CATEGORY  1: Negative.

## 2022-09-10 ENCOUNTER — Other Ambulatory Visit: Payer: Self-pay | Admitting: Family Medicine

## 2022-09-10 DIAGNOSIS — Z0000 Encounter for general adult medical examination without abnormal findings: Secondary | ICD-10-CM

## 2022-10-18 ENCOUNTER — Ambulatory Visit
Admission: RE | Admit: 2022-10-18 | Discharge: 2022-10-18 | Disposition: A | Discharge status: Home / Self Care | Payer: BC Managed Care – PPO | Source: Ambulatory Visit | Attending: Family Medicine | Admitting: Family Medicine

## 2022-10-18 DIAGNOSIS — Z0000 Encounter for general adult medical examination without abnormal findings: Secondary | ICD-10-CM

## 2023-09-27 ENCOUNTER — Other Ambulatory Visit: Payer: Self-pay | Admitting: Family Medicine

## 2023-09-27 DIAGNOSIS — Z1231 Encounter for screening mammogram for malignant neoplasm of breast: Secondary | ICD-10-CM

## 2023-10-17 ENCOUNTER — Other Ambulatory Visit (HOSPITAL_BASED_OUTPATIENT_CLINIC_OR_DEPARTMENT_OTHER): Payer: Self-pay | Admitting: Family Medicine

## 2023-10-17 DIAGNOSIS — M810 Age-related osteoporosis without current pathological fracture: Secondary | ICD-10-CM

## 2023-10-19 ENCOUNTER — Ambulatory Visit
Admission: RE | Admit: 2023-10-19 | Discharge: 2023-10-19 | Disposition: A | Discharge status: Home / Self Care | Payer: Self-pay | Source: Ambulatory Visit | Attending: Family Medicine | Admitting: Family Medicine

## 2023-10-19 DIAGNOSIS — Z1231 Encounter for screening mammogram for malignant neoplasm of breast: Secondary | ICD-10-CM

## 2023-12-26 ENCOUNTER — Ambulatory Visit (HOSPITAL_BASED_OUTPATIENT_CLINIC_OR_DEPARTMENT_OTHER)
Admission: RE | Admit: 2023-12-26 | Discharge: 2023-12-26 | Disposition: A | Discharge status: Home / Self Care | Source: Ambulatory Visit | Attending: Family Medicine | Admitting: Family Medicine

## 2023-12-26 DIAGNOSIS — M810 Age-related osteoporosis without current pathological fracture: Secondary | ICD-10-CM | POA: Insufficient documentation
